# Patient Record
Sex: Female | Born: 1980 | Race: Black or African American | Hispanic: No | Marital: Married | Smoking: Never smoker
Health system: Southern US, Community
[De-identification: ages and names within clinical notes are randomized; demographics above are authoritative.]

---

## 2008-07-13 ENCOUNTER — Ambulatory Visit (HOSPITAL_COMMUNITY): Admission: RE | Admit: 2008-07-13 | Discharge: 2008-07-13 | Payer: Self-pay | Admitting: Obstetrics and Gynecology

## 2008-08-12 ENCOUNTER — Ambulatory Visit (HOSPITAL_COMMUNITY): Admission: RE | Admit: 2008-08-12 | Discharge: 2008-08-12 | Payer: Self-pay | Admitting: Obstetrics and Gynecology

## 2009-02-21 ENCOUNTER — Encounter: Admission: RE | Admit: 2009-02-21 | Discharge: 2009-02-21 | Payer: Self-pay | Admitting: Specialist

## 2009-10-24 IMAGING — US US OB DETAIL+14 WK
1 series · 18 of 28 positions shown · non-contrast
Comparison: none

OBSTETRICAL ULTRASOUND:
 This ultrasound was performed in The [HOSPITAL], and the [facility] report will be stored to [REDACTED] PACS.

[Series 1: us ob detail+14 wk · 99 acquisitions, 18 frames shown]
[im 1/99]
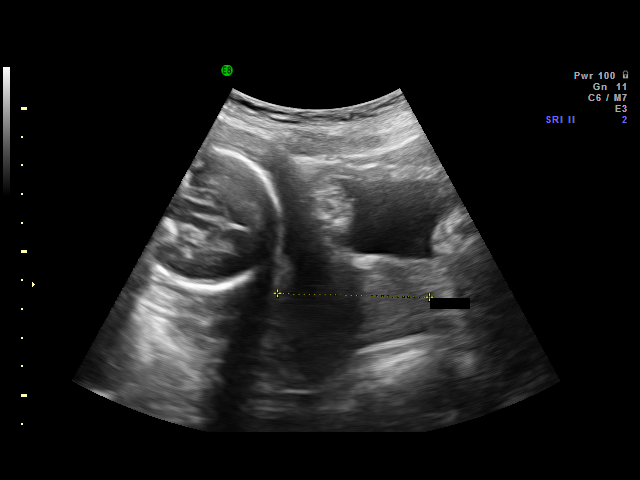
[im 8/99]
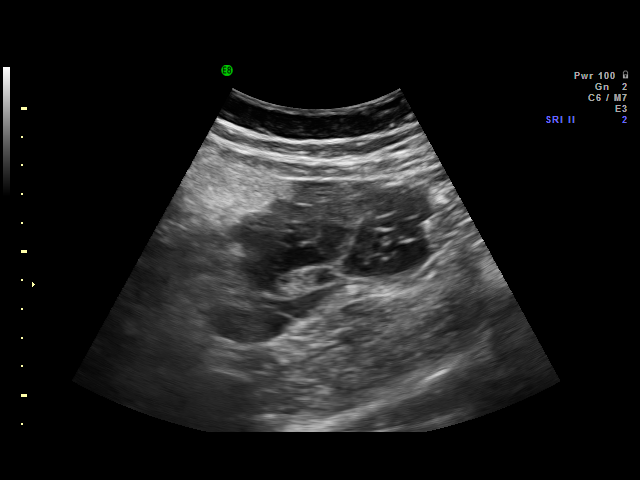
[im 11/99]
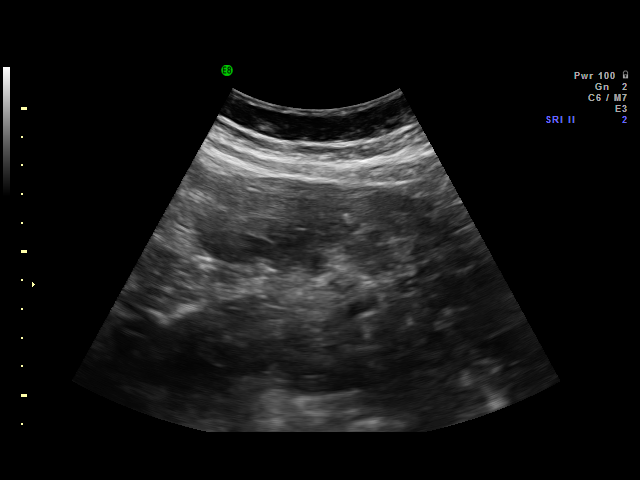
[im 19/99]
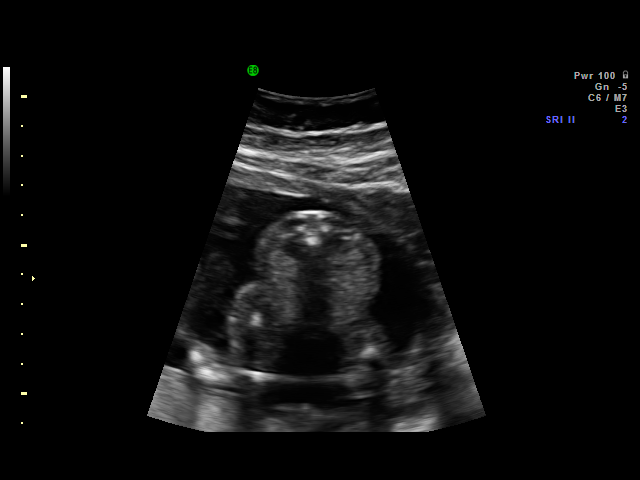
[im 26/99]
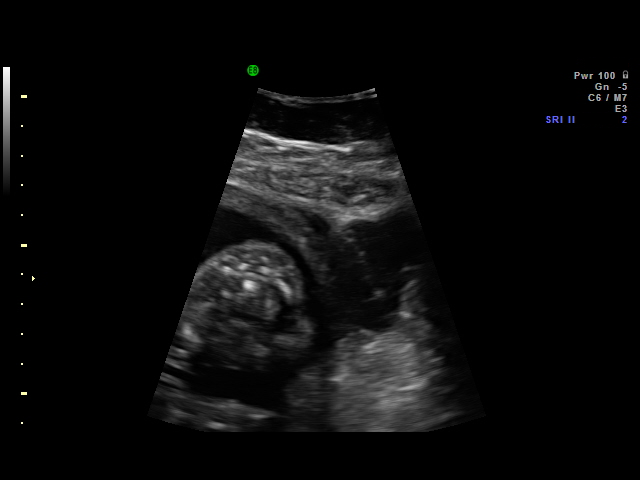
[im 30/99]
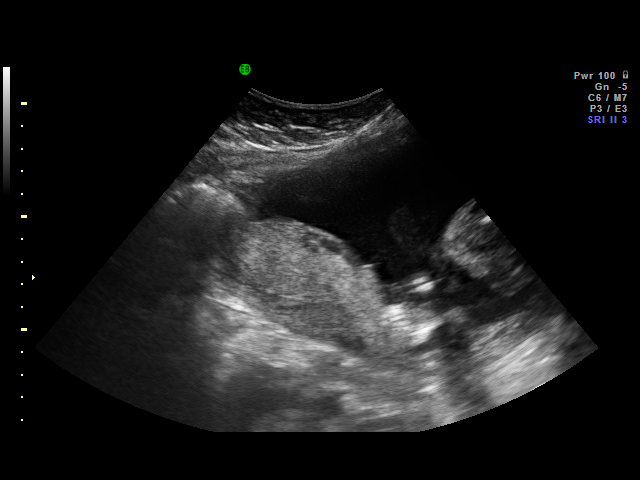
[im 37/99]
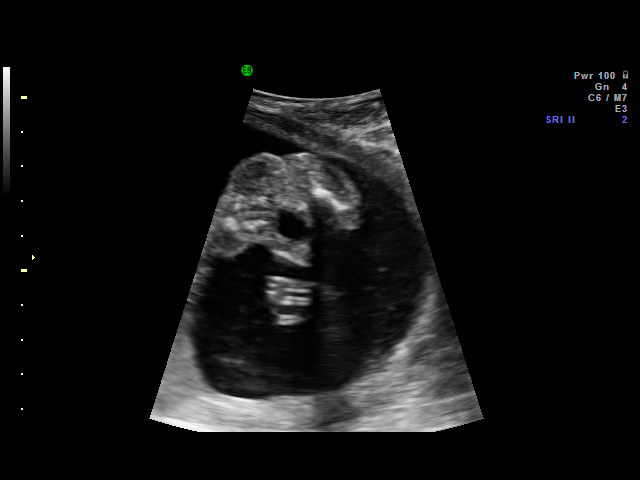
[im 40/99]
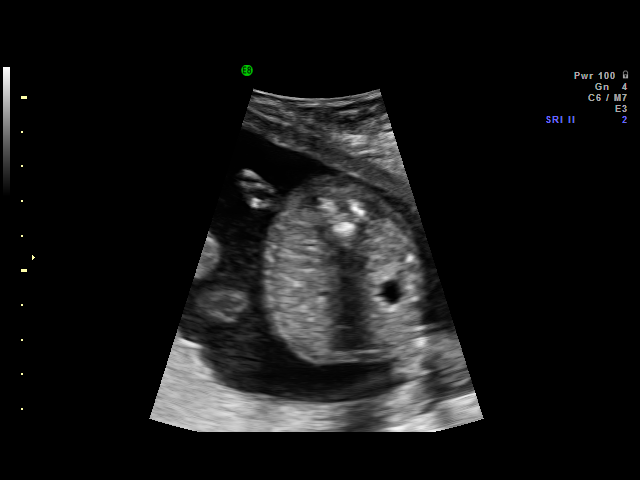
[im 48/99]
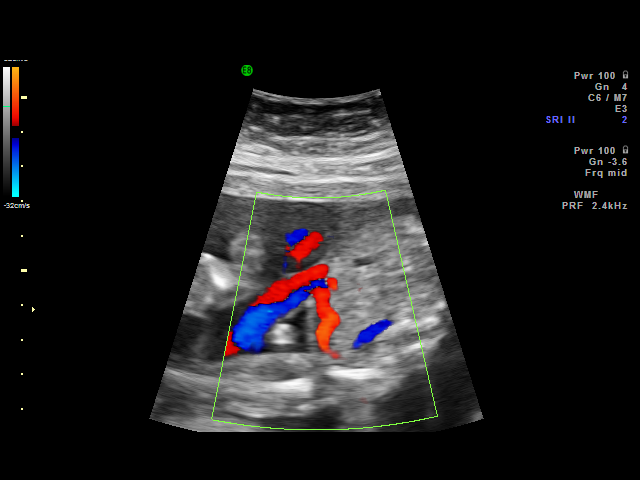
[im 51/99]
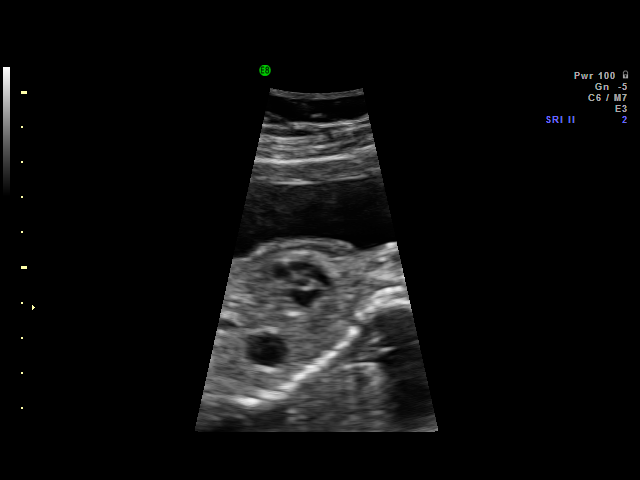
[im 59/99]
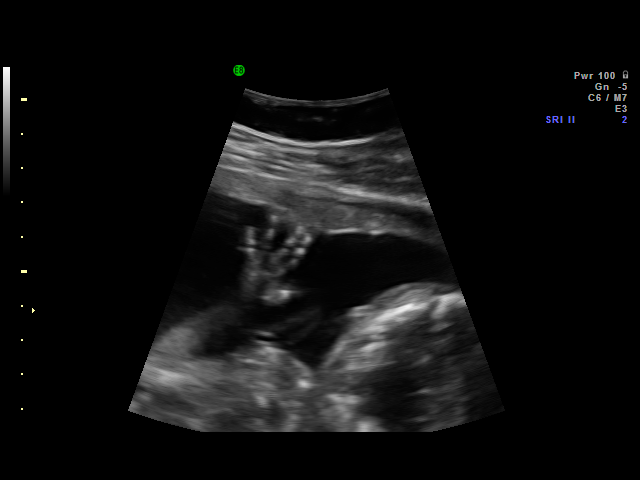
[im 62/99]
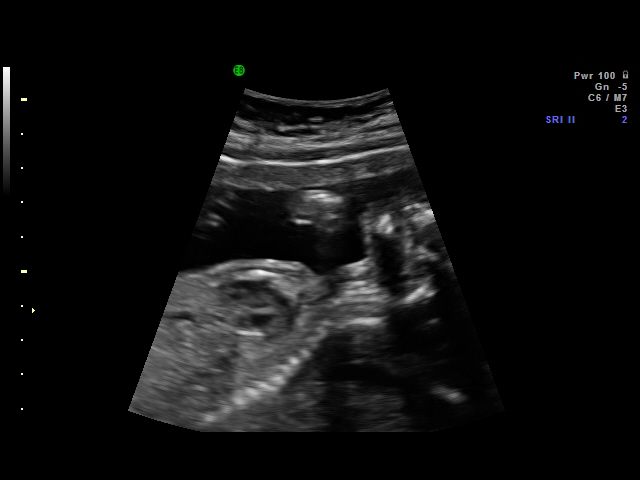
[im 69/99]
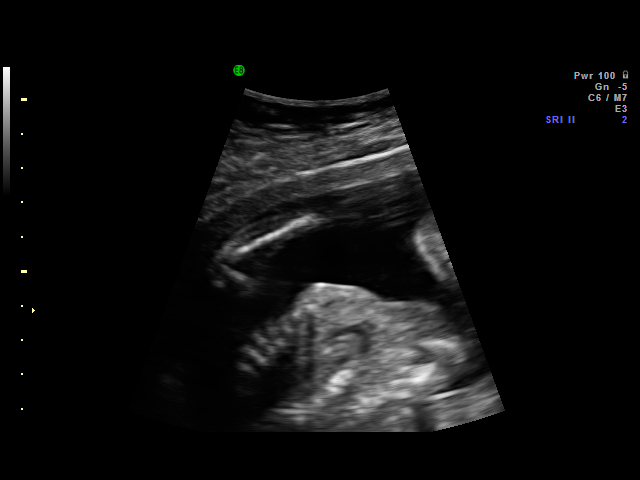
[im 77/99]
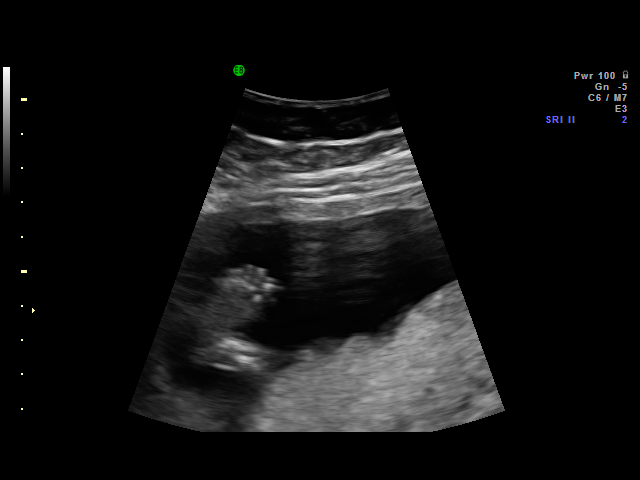
[im 80/99]
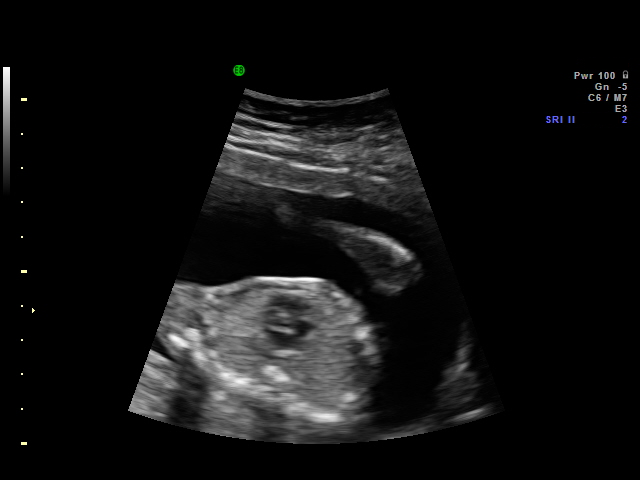
[im 88/99]
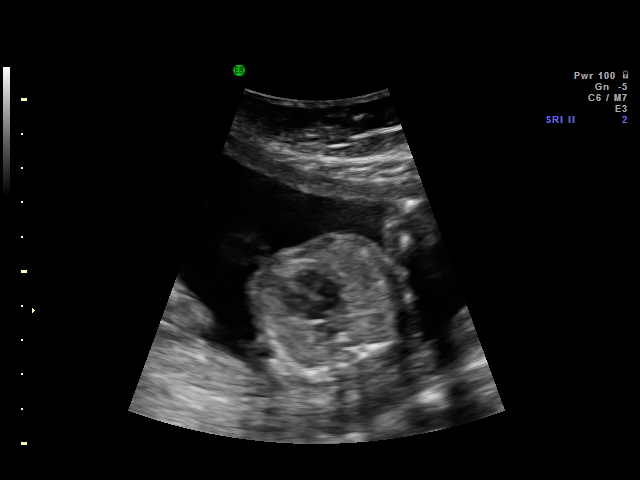
[im 91/99]
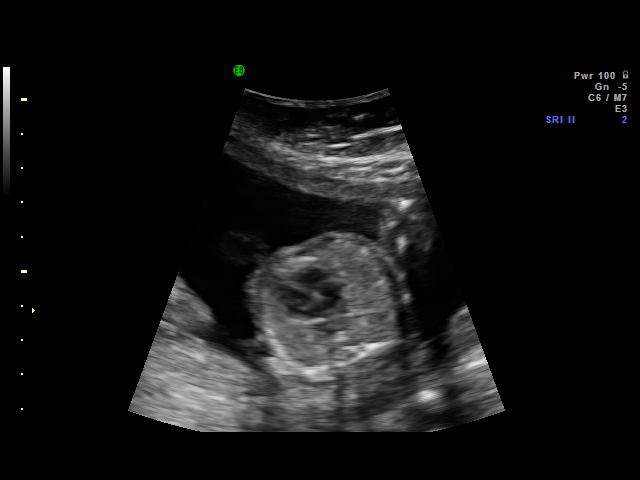
[im 99/99]
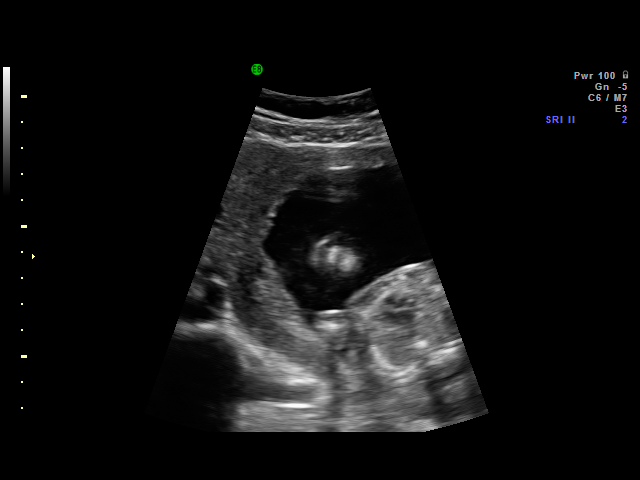

[18 of 28 positions shown; findings below may reference images not displayed]

IMPRESSION: [facility] has also been faxed to the ordering physician.

## 2011-06-26 ENCOUNTER — Other Ambulatory Visit (HOSPITAL_COMMUNITY)
Admission: RE | Admit: 2011-06-26 | Discharge: 2011-06-26 | Disposition: A | Discharge status: Home / Self Care | Payer: Self-pay | Source: Ambulatory Visit | Attending: Obstetrics and Gynecology | Admitting: Obstetrics and Gynecology

## 2011-06-26 DIAGNOSIS — Z124 Encounter for screening for malignant neoplasm of cervix: Secondary | ICD-10-CM | POA: Insufficient documentation

## 2014-05-31 ENCOUNTER — Encounter (HOSPITAL_BASED_OUTPATIENT_CLINIC_OR_DEPARTMENT_OTHER): Payer: Self-pay | Admitting: Emergency Medicine

## 2014-05-31 ENCOUNTER — Emergency Department (HOSPITAL_BASED_OUTPATIENT_CLINIC_OR_DEPARTMENT_OTHER)
Admission: EM | Admit: 2014-05-31 | Discharge: 2014-05-31 | Discharge status: Against Medical Advice | Payer: Commercial Managed Care - PPO | Attending: Emergency Medicine | Admitting: Emergency Medicine

## 2014-05-31 DIAGNOSIS — Z3A12 12 weeks gestation of pregnancy: Secondary | ICD-10-CM | POA: Diagnosis not present

## 2014-05-31 DIAGNOSIS — O26891 Other specified pregnancy related conditions, first trimester: Secondary | ICD-10-CM | POA: Insufficient documentation

## 2014-05-31 DIAGNOSIS — R109 Unspecified abdominal pain: Secondary | ICD-10-CM | POA: Diagnosis not present

## 2014-05-31 NOTE — ED Notes (Addendum)
adb pain x 45 min-[redacted] weeks pregnant-no vaginal d/c or bleeding-G3P2

## 2014-06-14 ENCOUNTER — Encounter (HOSPITAL_BASED_OUTPATIENT_CLINIC_OR_DEPARTMENT_OTHER): Payer: Self-pay | Admitting: Emergency Medicine

## 2023-09-11 DIAGNOSIS — G4726 Circadian rhythm sleep disorder, shift work type: Secondary | ICD-10-CM | POA: Diagnosis not present

## 2023-09-11 DIAGNOSIS — G44229 Chronic tension-type headache, not intractable: Secondary | ICD-10-CM | POA: Diagnosis not present

## 2023-09-11 DIAGNOSIS — G9389 Other specified disorders of brain: Secondary | ICD-10-CM | POA: Diagnosis not present

## 2023-09-18 DIAGNOSIS — Z1331 Encounter for screening for depression: Secondary | ICD-10-CM | POA: Diagnosis not present

## 2023-09-18 DIAGNOSIS — T8332XA Displacement of intrauterine contraceptive device, initial encounter: Secondary | ICD-10-CM | POA: Diagnosis not present

## 2023-09-18 DIAGNOSIS — Z30431 Encounter for routine checking of intrauterine contraceptive device: Secondary | ICD-10-CM | POA: Diagnosis not present

## 2023-09-18 DIAGNOSIS — Z1231 Encounter for screening mammogram for malignant neoplasm of breast: Secondary | ICD-10-CM | POA: Diagnosis not present

## 2023-09-18 DIAGNOSIS — Z01419 Encounter for gynecological examination (general) (routine) without abnormal findings: Secondary | ICD-10-CM | POA: Diagnosis not present

## 2023-09-24 DIAGNOSIS — T8332XA Displacement of intrauterine contraceptive device, initial encounter: Secondary | ICD-10-CM | POA: Diagnosis not present

## 2023-09-24 DIAGNOSIS — Z01812 Encounter for preprocedural laboratory examination: Secondary | ICD-10-CM | POA: Diagnosis not present

## 2023-09-24 DIAGNOSIS — Z30432 Encounter for removal of intrauterine contraceptive device: Secondary | ICD-10-CM | POA: Diagnosis not present

## 2024-01-21 DIAGNOSIS — R7303 Prediabetes: Secondary | ICD-10-CM | POA: Diagnosis not present

## 2024-01-21 DIAGNOSIS — H43393 Other vitreous opacities, bilateral: Secondary | ICD-10-CM | POA: Diagnosis not present

## 2024-01-21 DIAGNOSIS — H04123 Dry eye syndrome of bilateral lacrimal glands: Secondary | ICD-10-CM | POA: Diagnosis not present

## 2024-01-21 DIAGNOSIS — H52201 Unspecified astigmatism, right eye: Secondary | ICD-10-CM | POA: Diagnosis not present

## 2024-01-21 DIAGNOSIS — D571 Sickle-cell disease without crisis: Secondary | ICD-10-CM | POA: Diagnosis not present

## 2024-01-21 DIAGNOSIS — H5213 Myopia, bilateral: Secondary | ICD-10-CM | POA: Diagnosis not present

## 2024-01-21 DIAGNOSIS — H3689 Other retinal disorders in diseases classified elsewhere: Secondary | ICD-10-CM | POA: Diagnosis not present
